# Patient Record
Sex: Female | Born: 1980 | Race: White | Hispanic: No | Marital: Single | Smoking: Current every day smoker
Health system: Southern US, Community
[De-identification: ages and names within clinical notes are randomized; demographics above are authoritative.]

## PROBLEM LIST (undated history)

## (undated) DIAGNOSIS — B1920 Unspecified viral hepatitis C without hepatic coma: Secondary | ICD-10-CM

## (undated) DIAGNOSIS — J45909 Unspecified asthma, uncomplicated: Secondary | ICD-10-CM

---

## 2015-01-09 ENCOUNTER — Emergency Department (HOSPITAL_COMMUNITY)
Admission: EM | Admit: 2015-01-09 | Discharge: 2015-01-09 | Disposition: A | Discharge status: Home / Self Care | Payer: Medicaid - Out of State | Attending: Emergency Medicine | Admitting: Emergency Medicine

## 2015-01-09 ENCOUNTER — Encounter (HOSPITAL_COMMUNITY): Payer: Self-pay

## 2015-01-09 DIAGNOSIS — Z3202 Encounter for pregnancy test, result negative: Secondary | ICD-10-CM | POA: Diagnosis not present

## 2015-01-09 DIAGNOSIS — N939 Abnormal uterine and vaginal bleeding, unspecified: Secondary | ICD-10-CM

## 2015-01-09 DIAGNOSIS — N926 Irregular menstruation, unspecified: Secondary | ICD-10-CM | POA: Insufficient documentation

## 2015-01-09 DIAGNOSIS — Z720 Tobacco use: Secondary | ICD-10-CM | POA: Insufficient documentation

## 2015-01-09 DIAGNOSIS — Z8619 Personal history of other infectious and parasitic diseases: Secondary | ICD-10-CM | POA: Diagnosis not present

## 2015-01-09 HISTORY — DX: Unspecified viral hepatitis C without hepatic coma: B19.20

## 2015-01-09 LAB — URINALYSIS, ROUTINE W REFLEX MICROSCOPIC
Bilirubin Urine: NEGATIVE
GLUCOSE, UA: NEGATIVE mg/dL
Ketones, ur: NEGATIVE mg/dL
NITRITE: NEGATIVE
PH: 5.5 (ref 5.0–8.0)
Protein, ur: NEGATIVE mg/dL
Specific Gravity, Urine: 1.021 (ref 1.005–1.030)
Urobilinogen, UA: 0.2 mg/dL (ref 0.0–1.0)

## 2015-01-09 LAB — WET PREP, GENITAL
Trich, Wet Prep: NONE SEEN
YEAST WET PREP: NONE SEEN

## 2015-01-09 LAB — URINE MICROSCOPIC-ADD ON

## 2015-01-09 LAB — POC URINE PREG, ED: PREG TEST UR: NEGATIVE

## 2015-01-09 NOTE — Discharge Instructions (Signed)
Exam and lab work today were normal. Follow-up with women[facility] for any other issues with menstrual cycle-- ie continued bleeding, discharge, pelvic pain, etc.

## 2015-01-09 NOTE — ED Provider Notes (Signed)
CSN: [id number]     Arrival date & time 01/09/15  2120 History   First MD Initiated Contact with Patient 01/09/15 2211     Chief Complaint  Patient presents with  . Vaginal Bleeding  . Abdominal Cramping     (Consider location/radiation/quality/duration/timing/severity/associated sxs/prior Treatment) Patient is a 34 y.o. female presenting with vaginal bleeding and cramps. The history is provided by the patient and medical records.  Vaginal Bleeding Associated symptoms: abdominal pain (cramping)   Abdominal Cramping Associated symptoms include abdominal pain (cramping).   This is a 34 year old female with past medical history significant for hepatitis C, presenting to the ED for vaginal bleeding and abdominal cramping. Patient states she started her current menstrual cycle on Monday and has continued having bleeding. She states generally her cycles are 3 days in length, but she did not have a menstrual cycle in December or January. States in February she only had light spotting for 3 days. She states she has continued abdominal cramping which is not unusual during her menstrual period.  Denies any other type of abdominal pain.  Denies any fever or chills. No dysuria or vaginal discharge.  Patient is not currently on any form of contraception.  VSS.  Past Medical History  Diagnosis Date  . Hepatitis C    History reviewed. No pertinent past surgical history. No family history on file. History  Substance Use Topics  . Smoking status: Current Every Day Smoker  . Smokeless tobacco: Not on file  . Alcohol Use: Yes     Comment: occasionally    OB History    No data available     Review of Systems  Gastrointestinal: Positive for abdominal pain (cramping).  Genitourinary: Positive for vaginal bleeding.  All other systems reviewed and are negative.     Allergies  Review of patient's allergies indicates no known allergies.  Home Medications   Prior to Admission medications    Medication Sig Start Date End Date Taking? Authorizing Provider  Aspirin-Cinnamedrine-Caffeine (MIDOL MAXIMUM STRENGTH PO) Take 2 tablets by mouth once.   Yes Historical Provider, MD   BP 106/59 mmHg  Pulse 97  Temp(Src) 98.1 F (36.7 C) (Oral)  Resp 16  SpO2 94%  LMP 01/03/2015   Physical Exam  Constitutional: She is oriented to person, place, and time. She appears well-developed and well-nourished. No distress.  HENT:  Head: Normocephalic and atraumatic.  Mouth/Throat: Oropharynx is clear and moist.  Eyes: Conjunctivae and EOM are normal. Pupils are equal, round, and reactive to light.  Neck: Normal range of motion. Neck supple.  Cardiovascular: Normal rate, regular rhythm and normal heart sounds.   Pulmonary/Chest: Effort normal and breath sounds normal. No respiratory distress. She has no wheezes.  Abdominal: Soft. Bowel sounds are normal. There is no tenderness. There is no guarding and no CVA tenderness.  Abdomen soft, non-distended, no focal tenderness or peritoneal signs  Genitourinary: Cervix exhibits no motion tenderness. Right adnexum displays no tenderness. Left adnexum displays no tenderness. There is bleeding in the vagina. No tenderness in the vagina. No foreign body around the vagina. No vaginal discharge found.  Normal female external genitalia without visible lesions; mild amount of blood noted in vaginal vault; cervical os closed, no friability; no adnexal or CMT  Musculoskeletal: Normal range of motion.  Neurological: She is alert and oriented to person, place, and time.  Skin: Skin is warm and dry. She is not diaphoretic.  Psychiatric: She has a normal mood and affect.  Nursing note and  vitals reviewed.   ED Course  Procedures (including critical care time) Labs Review Labs Reviewed  WET PREP, GENITAL - Abnormal; Notable for the following:    Clue Cells Wet Prep HPF POC FEW (*)    WBC, Wet Prep HPF POC FEW (*)    All other components within normal limits   URINALYSIS, ROUTINE W REFLEX MICROSCOPIC - Abnormal; Notable for the following:    APPearance CLOUDY (*)    Hgb urine dipstick LARGE (*)    Leukocytes, UA SMALL (*)    All other components within normal limits  URINE MICROSCOPIC-ADD ON  POC URINE PREG, ED  GC/CHLAMYDIA PROBE AMP ([place])    Imaging Review No results found.   EKG Interpretation None      MDM   Final diagnoses:  Irregular menstrual cycle  Vaginal bleeding   34 year old female with vaginal bleeding. She has had irregular vaginal bleeding for the past 3 months, this month has been somewhat prolonged. She notes associated abdominal cramping which is not unusual for her. Denies dizziness, lightheadedness, or syncope and VS are stable.  On exam, patient is afebrile and nontoxic in appearance. She is in no acute distress and her abdominal exam is benign. Urine pregnancy negative. UA noninfectious. Pelvic exam with mild amount of vaginal bleeding, cervical os closed. There is no adnexal or cervical motion tenderness.  Wet prep with few clue cells, however doubt this indicates infection with BV and patient denies sx of vaginal discharge. Gc/chl pending.  Suspect prolonged bleeding due to her menstrual irregularities over the past 3 months.  Patient encouraged [facility] with [facility] for further evaluation of this.  Discussed plan with patient, he/she acknowledged understanding and agreed with plan of care.  Return precautions given for new or worsening symptoms.  Garlon Hatchet, PA-C 01/10/15 0007  Cathren Laine, MD 01/11/15 [phone]

## 2015-01-09 NOTE — ED Notes (Signed)
Pt presents with c/o heavy vaginal bleeding and abdominal cramping. Pt reports that she started her period on Monday but is still bleeding very heavily and is having lower right abdominal cramping. Pt reports that this much bleeding is abnormal for her.

## 2015-01-10 LAB — GC/CHLAMYDIA PROBE AMP (~~LOC~~) NOT AT ARMC
Chlamydia: NEGATIVE
NEISSERIA GONORRHEA: NEGATIVE

## 2015-06-23 ENCOUNTER — Encounter (HOSPITAL_COMMUNITY): Payer: Self-pay | Admitting: Emergency Medicine

## 2015-06-23 ENCOUNTER — Emergency Department (HOSPITAL_COMMUNITY): Payer: Medicaid - Out of State

## 2015-06-23 ENCOUNTER — Emergency Department (HOSPITAL_COMMUNITY)
Admission: EM | Admit: 2015-06-23 | Discharge: 2015-06-24 | Disposition: A | Discharge status: Home / Self Care | Payer: Medicaid - Out of State | Attending: Emergency Medicine | Admitting: Emergency Medicine

## 2015-06-23 DIAGNOSIS — J069 Acute upper respiratory infection, unspecified: Secondary | ICD-10-CM | POA: Insufficient documentation

## 2015-06-23 DIAGNOSIS — J45901 Unspecified asthma with (acute) exacerbation: Secondary | ICD-10-CM | POA: Insufficient documentation

## 2015-06-23 DIAGNOSIS — Z720 Tobacco use: Secondary | ICD-10-CM | POA: Insufficient documentation

## 2015-06-23 DIAGNOSIS — Z8619 Personal history of other infectious and parasitic diseases: Secondary | ICD-10-CM | POA: Insufficient documentation

## 2015-06-23 HISTORY — DX: Unspecified asthma, uncomplicated: J45.909

## 2015-06-23 LAB — CBC WITH DIFFERENTIAL/PLATELET
BASOS ABS: 0 10*3/uL (ref 0.0–0.1)
Basophils Relative: 1 % (ref 0–1)
Eosinophils Absolute: 0.2 10*3/uL (ref 0.0–0.7)
Eosinophils Relative: 2 % (ref 0–5)
HEMATOCRIT: 38.8 % (ref 36.0–46.0)
Hemoglobin: 13 g/dL (ref 12.0–15.0)
LYMPHS ABS: 3 10*3/uL (ref 0.7–4.0)
LYMPHS PCT: 35 % (ref 12–46)
MCH: 31.2 pg (ref 26.0–34.0)
MCHC: 33.5 g/dL (ref 30.0–36.0)
MCV: 93 fL (ref 78.0–100.0)
MONO ABS: 0.9 10*3/uL (ref 0.1–1.0)
Monocytes Relative: 10 % (ref 3–12)
NEUTROS ABS: 4.4 10*3/uL (ref 1.7–7.7)
Neutrophils Relative %: 52 % (ref 43–77)
Platelets: 211 10*3/uL (ref 150–400)
RBC: 4.17 MIL/uL (ref 3.87–5.11)
RDW: 13.7 % (ref 11.5–15.5)
WBC: 8.5 10*3/uL (ref 4.0–10.5)

## 2015-06-23 LAB — I-STAT CHEM 8, ED
BUN: 20 mg/dL (ref 6–20)
CALCIUM ION: 1.15 mmol/L (ref 1.12–1.23)
CHLORIDE: 101 mmol/L (ref 101–111)
CREATININE: 0.7 mg/dL (ref 0.44–1.00)
GLUCOSE: 98 mg/dL (ref 65–99)
HCT: 40 % (ref 36.0–46.0)
Hemoglobin: 13.6 g/dL (ref 12.0–15.0)
Potassium: 3.9 mmol/L (ref 3.5–5.1)
Sodium: 140 mmol/L (ref 135–145)
TCO2: 27 mmol/L (ref 0–100)

## 2015-06-23 LAB — I-STAT TROPONIN, ED: Troponin i, poc: 0 ng/mL (ref 0.00–0.08)

## 2015-06-23 MED ORDER — PREDNISONE 20 MG PO TABS
60.0000 mg | ORAL_TABLET | Freq: Once | ORAL | Status: AC
  Administered 2015-06-23: 60 mg via ORAL
  Filled 2015-06-23: qty 3

## 2015-06-23 MED ORDER — IPRATROPIUM BROMIDE 0.02 % IN SOLN
0.5000 mg | Freq: Once | RESPIRATORY_TRACT | Status: AC
  Administered 2015-06-23: 0.5 mg via RESPIRATORY_TRACT
  Filled 2015-06-23: qty 2.5

## 2015-06-23 MED ORDER — ALBUTEROL SULFATE (2.5 MG/3ML) 0.083% IN NEBU
5.0000 mg | INHALATION_SOLUTION | Freq: Once | RESPIRATORY_TRACT | Status: AC
  Administered 2015-06-23: 5 mg via RESPIRATORY_TRACT
  Filled 2015-06-23: qty 6

## 2015-06-23 NOTE — ED Notes (Signed)
Pt states she has been congested and had a cough for the past couple of days   Pt states her cough is productive with clear to cloudy to yellow in color  Pt states she feels short of breath that is worse on exertion  Pt has hx of asthma and is wheezing in triage

## 2015-06-23 NOTE — ED Provider Notes (Signed)
CSN: [id number]     Arrival date & time 06/23/15  2151 History   First MD Initiated Contact with Patient 06/23/15 2215     Chief Complaint  Patient presents with  . Cough     (Consider location/radiation/quality/duration/timing/severity/associated sxs/prior Treatment) HPI Comments: Patient with a history of Asthma presents today with complaints of SOB, productive cough, and wheezing.  She reports onset of symptoms two days ago.  She states that symptoms are gradually worsening.  She reports associated nasal congestion and states that she has had chest pain after coughing.  She denies chest pain at this time.  She denies fever, chills, nausea, vomiting, sinus pain, or hemoptysis.  She states that she has been taking Nyquil, Mucinex, and using her Albuterol inhaler without relief.  She currently smokes 1 ppd.  The history is provided by the patient.    Past Medical History  Diagnosis Date  . Hepatitis C   . Asthma    History reviewed. No pertinent past surgical history. Family History  Problem Relation Age of Onset  . Thyroid disease Mother   . Cancer Other   . Diabetes Other    Social History  Substance Use Topics  . Smoking status: Current Every Day Smoker -- 1.00 packs/day    Types: Cigarettes  . Smokeless tobacco: None  . Alcohol Use: No   OB History    No data available     Review of Systems  All other systems reviewed and are negative.     Allergies  Review of patient's allergies indicates no known allergies.  Home Medications   Prior to Admission medications   Medication Sig Start Date End Date Taking? Authorizing Provider  dextromethorphan-guaiFENesin (MUCINEX DM) 30-600 MG per 12 hr tablet Take 1 tablet by mouth 2 (two) times daily as needed for cough.   Yes Historical Provider, MD  Pseudoeph-Doxylamine-DM-APAP (NYQUIL PO) Take 30 mLs by mouth daily as needed (cold symptoms).   Yes Historical Provider, MD   BP 125/76 mmHg  Pulse 88  Temp(Src) 98.2 F  (36.8 C) (Oral)  Resp 24  SpO2 94%  LMP 06/14/2015 (Exact Date) Physical Exam  Constitutional: She appears well-developed and well-nourished.  HENT:  Head: Normocephalic and atraumatic.  Mouth/Throat: Oropharynx is clear and moist.  Neck: Normal range of motion. Neck supple.  Cardiovascular: Normal rate, regular rhythm and normal heart sounds.   Pulmonary/Chest: Effort normal. No respiratory distress. She has wheezes. She has no rales. She exhibits no tenderness.  Diffuse inspiratory and expiratory wheezing on exam  Musculoskeletal: Normal range of motion.  Neurological: She is alert.  Skin: Skin is warm and dry.  Psychiatric: She has a normal mood and affect.    ED Course  Procedures (including critical care time) Labs Review Labs Reviewed  CBC WITH DIFFERENTIAL/PLATELET  I-STAT CHEM 8, ED  I-STAT TROPOININ, ED    Imaging Review No results found. I have personally reviewed and evaluated these images and lab results as part of my medical decision-making.   EKG Interpretation   Date/Time:  Thursday June 23 2015 22:35:06 EDT Ventricular Rate:  83 PR Interval:  134 QRS Duration: 99 QT Interval:  369 QTC Calculation: 434 R Axis:   76 Text Interpretation:  Sinus rhythm ED PHYSICIAN INTERPRETATION AVAILABLE  IN [facility] Confirmed by TEST, Record ([id number]) on 06/24/2015 7:18:55  AM     2:30 AM Ambulated patient.  Pulse ox 92-93 on RA with brisk ambulation.  No SOB or CP with ambulation.  MDM   Final diagnoses:  None   Patient ambulated in ED with O2 saturations maintained >92, no current signs of respiratory distress. Lung exam improved after nebulizer treatment. Prednisone given in the ED and pt will bd dc with 5 day burst. Pt states they are breathing at baseline. Pt has been instructed to continue using prescribed medications and to speak with PCP about today's exacerbation.  Patient stable for discharge.  Return precautions given.     Santiago Glad,  PA-C 06/25/15 Ernestina Columbia  Bethann Berkshire, MD 06/25/15 2330

## 2015-06-24 MED ORDER — IPRATROPIUM BROMIDE 0.02 % IN SOLN
0.5000 mg | Freq: Once | RESPIRATORY_TRACT | Status: AC
  Administered 2015-06-24: 0.5 mg via RESPIRATORY_TRACT
  Filled 2015-06-24: qty 2.5

## 2015-06-24 MED ORDER — ALBUTEROL SULFATE (2.5 MG/3ML) 0.083% IN NEBU
5.0000 mg | INHALATION_SOLUTION | Freq: Once | RESPIRATORY_TRACT | Status: AC
  Administered 2015-06-24: 5 mg via RESPIRATORY_TRACT
  Filled 2015-06-24: qty 6

## 2015-06-24 MED ORDER — PREDNISONE 20 MG PO TABS: 60.0000 mg | ORAL_TABLET | Freq: Every day | ORAL | Status: AC

## 2015-06-24 MED ORDER — ALBUTEROL SULFATE HFA 108 (90 BASE) MCG/ACT IN AERS: 1.0000 | INHALATION_SPRAY | Freq: Four times a day (QID) | RESPIRATORY_TRACT | Status: AC | PRN

## 2016-07-18 ENCOUNTER — Emergency Department (HOSPITAL_COMMUNITY)
Admission: EM | Admit: 2016-07-18 | Discharge: 2016-07-18 | Disposition: A | Discharge status: Home / Self Care | Payer: Medicaid - Out of State | Attending: Emergency Medicine | Admitting: Emergency Medicine

## 2016-07-18 ENCOUNTER — Emergency Department (HOSPITAL_COMMUNITY): Payer: Medicaid - Out of State

## 2016-07-18 ENCOUNTER — Encounter (HOSPITAL_COMMUNITY): Payer: Self-pay

## 2016-07-18 DIAGNOSIS — S01551A Open bite of lip, initial encounter: Secondary | ICD-10-CM | POA: Insufficient documentation

## 2016-07-18 DIAGNOSIS — S61051A Open bite of right thumb without damage to nail, initial encounter: Secondary | ICD-10-CM | POA: Insufficient documentation

## 2016-07-18 DIAGNOSIS — S51052A Open bite, left elbow, initial encounter: Secondary | ICD-10-CM | POA: Insufficient documentation

## 2016-07-18 DIAGNOSIS — S91352A Open bite, left foot, initial encounter: Secondary | ICD-10-CM | POA: Insufficient documentation

## 2016-07-18 DIAGNOSIS — S51851A Open bite of right forearm, initial encounter: Secondary | ICD-10-CM | POA: Insufficient documentation

## 2016-07-18 DIAGNOSIS — Y929 Unspecified place or not applicable: Secondary | ICD-10-CM | POA: Insufficient documentation

## 2016-07-18 DIAGNOSIS — Z23 Encounter for immunization: Secondary | ICD-10-CM | POA: Insufficient documentation

## 2016-07-18 DIAGNOSIS — J45909 Unspecified asthma, uncomplicated: Secondary | ICD-10-CM | POA: Insufficient documentation

## 2016-07-18 DIAGNOSIS — Y999 Unspecified external cause status: Secondary | ICD-10-CM | POA: Insufficient documentation

## 2016-07-18 DIAGNOSIS — W540XXA Bitten by dog, initial encounter: Secondary | ICD-10-CM | POA: Insufficient documentation

## 2016-07-18 DIAGNOSIS — Y9389 Activity, other specified: Secondary | ICD-10-CM | POA: Insufficient documentation

## 2016-07-18 DIAGNOSIS — S61451A Open bite of right hand, initial encounter: Secondary | ICD-10-CM | POA: Insufficient documentation

## 2016-07-18 DIAGNOSIS — F17210 Nicotine dependence, cigarettes, uncomplicated: Secondary | ICD-10-CM | POA: Insufficient documentation

## 2016-07-18 MED ORDER — CEPHALEXIN 500 MG PO CAPS: 500.0000 mg | ORAL_CAPSULE | Freq: Four times a day (QID) | ORAL | 0 refills | Status: AC

## 2016-07-18 MED ORDER — TETANUS-DIPHTH-ACELL PERTUSSIS 5-2.5-18.5 LF-MCG/0.5 IM SUSP
0.5000 mL | Freq: Once | INTRAMUSCULAR | Status: AC
  Administered 2016-07-18: 0.5 mL via INTRAMUSCULAR
  Filled 2016-07-18: qty 0.5

## 2016-07-18 MED ORDER — ONDANSETRON HCL 4 MG/2ML IJ SOLN
4.0000 mg | Freq: Once | INTRAMUSCULAR | Status: AC
  Administered 2016-07-18: 4 mg via INTRAVENOUS
  Filled 2016-07-18: qty 2

## 2016-07-18 MED ORDER — SODIUM CHLORIDE 0.9 % IV BOLUS (SEPSIS): 1000.0000 mL | Freq: Once | INTRAVENOUS | Status: DC

## 2016-07-18 MED ORDER — LIDOCAINE HCL (PF) 2 % IJ SOLN
10.0000 mL | Freq: Once | INTRAMUSCULAR | Status: AC
  Administered 2016-07-18: 10 mL
  Filled 2016-07-18: qty 10

## 2016-07-18 MED ORDER — LIDOCAINE-EPINEPHRINE (PF) 2 %-1:200000 IJ SOLN
20.0000 mL | Freq: Once | INTRAMUSCULAR | Status: AC
  Administered 2016-07-18: 20 mL
  Filled 2016-07-18: qty 20

## 2016-07-18 MED ORDER — HYDROCODONE-ACETAMINOPHEN 5-325 MG PO TABS: 1.0000 | ORAL_TABLET | ORAL | 0 refills | Status: AC | PRN

## 2016-07-18 MED ORDER — SODIUM CHLORIDE 0.9 % IV SOLN
3.0000 g | Freq: Once | INTRAVENOUS | Status: AC
  Administered 2016-07-18: 3 g via INTRAVENOUS
  Filled 2016-07-18: qty 3

## 2016-07-18 MED ORDER — MORPHINE SULFATE (PF) 4 MG/ML IV SOLN
4.0000 mg | Freq: Once | INTRAVENOUS | Status: AC
Start: 2016-07-18 — End: 2016-07-18
  Administered 2016-07-18: 4 mg via INTRAVENOUS
  Filled 2016-07-18: qty 1

## 2016-07-18 MED ORDER — BACITRACIN ZINC 500 UNIT/GM EX OINT
TOPICAL_OINTMENT | CUTANEOUS | Status: AC
  Filled 2016-07-18: qty 2.7

## 2016-07-18 NOTE — ED Provider Notes (Signed)
[facility]-EMERGENCY DEPT Provider Note   CSN: [id number] Arrival date & time: 07/18/16  1144  By signing my name below, I, Placido Sou, attest that this documentation has been prepared under the direction and in the presence of Jacalyn Lefevre, MD. Electronically Signed: Placido Sou, ED Scribe. 07/18/16. 12:17 PM.   History   Chief Complaint Chief Complaint  Patient presents with  . Animal Bite    HPI HPI Comments: Megan Calhoun is a 35 y.o. female who presents to the Emergency Department by ambulance complaining of multiple dog bites. Pt states that she has two pit bulls which were fighting and attempted to break them up resulting in multiple wounds. Pt reports bite marks and scratches to her right forearm, right hand, left arm, right thigh, left foot and right lower lip. Pt also has a moderate laceration with mild bleeding to her right forearm. Pt denies her tetanus is UTD. Pt is unsure if the dog's are UTD on their vaccinations. Animal control has been notified regarding the incident. No other associated symptoms at this time.   Pt said the dogs are normally good friends, but 1 of them had puppies, and pt thinks the other one was jealous.  Otherwise both dogs have been acting normally.  The history is provided by the patient. No language interpreter was used.    Past Medical History:  Diagnosis Date  . Asthma   . Hepatitis C     There are no active problems to display for this patient.   History reviewed. No pertinent surgical history.  OB History    No data available       Home Medications    Prior to Admission medications   Medication Sig Start Date End Date Taking? Authorizing Provider  albuterol (PROVENTIL HFA;VENTOLIN HFA) 108 (90 BASE) MCG/ACT inhaler Inhale 1-2 puffs into the lungs every 6 (six) hours as needed for wheezing or shortness of breath. 06/24/15   Heather Laisure, PA-C  cephALEXin (KEFLEX) 500 MG capsule Take 1 capsule (500 mg total) by mouth 4  (four) times daily. 07/18/16   Jacalyn Lefevre, MD  dextromethorphan-guaiFENesin [facility] DM) 30-600 MG per 12 hr tablet Take 1 tablet by mouth 2 (two) times daily as needed for cough.    Historical Provider, MD  HYDROcodone-acetaminophen (NORCO/VICODIN) 5-325 MG tablet Take 1 tablet by mouth every 4 (four) hours as needed. 07/18/16   Jacalyn Lefevre, MD  predniSONE (DELTASONE) 20 MG tablet Take 3 tablets (60 mg total) by mouth daily. 06/24/15   Heather Laisure, PA-C  Pseudoeph-Doxylamine-DM-APAP (NYQUIL PO) Take 30 mLs by mouth daily as needed (cold symptoms).    Historical Provider, MD    Family History Family History  Problem Relation Age of Onset  . Thyroid disease Mother   . Cancer Other   . Diabetes Other     Social History Social History  Substance Use Topics  . Smoking status: Current Every Day Smoker    Packs/day: 1.00    Types: Cigarettes  . Smokeless tobacco: Never Used  . Alcohol use No     Allergies   Review of patient's allergies indicates no known allergies.   Review of Systems Review of Systems  Musculoskeletal: Positive for myalgias.  Skin: Positive for wound.  Neurological: Negative for syncope and numbness.  All other systems reviewed and are negative.  Physical Exam Updated Vital Signs BP 122/77 (BP Location: Left Arm)   Pulse 75   Temp 97.6 F (36.4 C) (Oral)   Resp 16  Ht 5\' 4"  (1.626 m)   Wt 155 lb (70.3 kg)   LMP 07/04/2016   SpO2 100%   BMI 26.61 kg/m   Physical Exam  Constitutional: She is oriented to person, place, and time. She appears well-developed and well-nourished.  HENT:  Head: Normocephalic and atraumatic.  Eyes: EOM are normal.  Neck: Normal range of motion.  Cardiovascular: Normal rate.   Pulmonary/Chest: Effort normal. No respiratory distress.  Abdominal: Soft.  Musculoskeletal: Normal range of motion.  Neurological: She is alert and oriented to person, place, and time.  Skin: Skin is warm and dry. Abrasion and laceration  noted.  Laceration and abrasions noted to the right forearm. Multiple bite marks to the right hand and thumb. Bite marks noted to the left arm. Scratches to the right thigh. Bite marks to the left foot. Bite mark to the right lower lip.    Psychiatric: She has a normal mood and affect.  Nursing note and vitals reviewed.  ED Treatments / Results  Labs (all labs ordered are listed, but only abnormal results are displayed) Labs Reviewed  PREGNANCY, URINE    EKG  EKG Interpretation None       Radiology Dg Hand Complete Right  Result Date: 07/18/2016 CLINICAL DATA:  Multiple dog bites to the right hand today. EXAM: RIGHT HAND - COMPLETE 3+ VIEW COMPARISON:  None. FINDINGS: There is no evidence of fracture or dislocation. There is no evidence of arthropathy or other focal bone abnormality. No radiodense foreign bodies. Soft tissue swelling with some air in the soft tissues. IMPRESSION: Soft tissue swelling with air in the soft tissues. Electronically Signed   By: Francene BoyersJames  Maxwell M.D.   On: 07/18/2016 12:54    Procedures .Marland Kitchen.Laceration Repair Date/Time: 07/18/2016 1:34 PM Performed by: Jacalyn LefevreHAVILAND, Barnett Elzey Authorized by: Jacalyn LefevreHAVILAND, Peterson Mathey   Consent:    Consent obtained:  Verbal   Consent given by:  Patient   Risks discussed:  Infection, pain, need for additional repair and poor wound healing   Alternatives discussed:  No treatment Anesthesia (see MAR for exact dosages):    Anesthesia method:  Local infiltration   Local anesthetic:  Lidocaine 2% WITH epi Laceration details:    Location:  Shoulder/arm   Shoulder/arm location:  R lower arm Repair type:    Repair type:  Simple Pre-procedure details:    Preparation:  Patient was prepped and draped in usual sterile fashion Exploration:    Hemostasis achieved with:  Epinephrine   Wound exploration: wound explored through full range of motion     Contaminated: no   Treatment:    Area cleansed with:  Saline   Amount of cleaning:  Standard    Irrigation solution:  Sterile saline   Visualized foreign bodies/material removed: no   Skin repair:    Repair method:  Sutures   Suture size:  4-0   Suture material:  Nylon   Suture technique:  Simple interrupted   Number of sutures:  11 Approximation:    Approximation:  Close Post-procedure details:    Dressing:  Antibiotic ointment, non-adherent dressing and sterile dressing   Patient tolerance of procedure:  Tolerated well, no immediate complications Comments:     Pt had 4 bite wounds to right forearm.  Each was approximately 1 cm.  Total of 11 stitches in forearm. Marland Kitchen..Laceration Repair Date/Time: 07/18/2016 1:36 PM Performed by: Jacalyn LefevreHAVILAND, Eamon Tantillo Authorized by: Jacalyn LefevreHAVILAND, Deyanna Mctier   Consent:    Consent obtained:  Verbal   Consent given by:  Patient  Risks discussed:  Infection, pain and need for additional repair   Alternatives discussed:  No treatment Anesthesia (see MAR for exact dosages):    Anesthesia method:  Local infiltration   Local anesthetic:  Lidocaine 2% w/o epi Laceration details:    Location:  Finger   Finger location:  R thumb Repair type:    Repair type:  Simple Pre-procedure details:    Preparation:  Patient was prepped and draped in usual sterile fashion Treatment:    Area cleansed with:  Saline   Amount of cleaning:  Standard   Irrigation solution:  Sterile saline Skin repair:    Repair method:  Sutures   Suture size:  4-0   Suture material:  Nylon   Number of sutures:  2 Approximation:    Approximation:  Close Post-procedure details:    Dressing:  Antibiotic ointment, non-adherent dressing and sterile dressing   Patient tolerance of procedure:  Tolerated well, no immediate complications Comments:     Pt had 2 small bite marks to base of right thumb.  1 suture was placed in each.    DIAGNOSTIC STUDIES: Oxygen Saturation is 100% on RA, normal by my interpretation.    COORDINATION OF CARE: 12:16 PM Discussed next steps with pt. Pt verbalized  understanding and is agreeable with the plan.    Medications Ordered in ED Medications  sodium chloride 0.9 % bolus 1,000 mL (not administered)  Ampicillin-Sulbactam (UNASYN) 3 g in sodium chloride 0.9 % 100 mL IVPB (3 g Intravenous New Bag/Given 07/18/16 1308)  bacitracin 500 UNIT/GM ointment (not administered)  Tdap (BOOSTRIX) injection 0.5 mL (0.5 mLs Intramuscular Given 07/18/16 1307)  morphine 4 MG/ML injection 4 mg (4 mg Intravenous Given 07/18/16 1307)  ondansetron (ZOFRAN) injection 4 mg (4 mg Intravenous Given 07/18/16 1307)  lidocaine-EPINEPHrine (XYLOCAINE W/EPI) 2 %-1:200000 (PF) injection 20 mL (20 mLs Infiltration Given 07/18/16 1307)  lidocaine (XYLOCAINE) 2 % injection 10 mL (10 mLs Infiltration Given 07/18/16 1308)     Initial Impression / Assessment and Plan / ED Course  I have reviewed the triage vital signs and the nursing notes.  Pertinent labs & imaging results that were available during my care of the patient were reviewed by me and considered in my medical decision making (see chart for details).  Clinical Course    I personally performed the services described in this documentation, which was scribed in my presence. The recorded information has been reviewed and is accurate.   Pt given tetanus, IV unasyn here to prevent infection.  She will be d/c'd home on keflex.  She knows that animal bites have a high risk of infection and she knows to return if she notices any redness or swelling.    The pt said that 1 of her dogs did not have her rabies vaccine.  She opts not to get rabies shots now as the dogs have been acting normally until this episode.   Final Clinical Impressions(s) / ED Diagnoses   Final diagnoses:  Dog bite of right hand, initial encounter  Dog bite of right forearm, initial encounter  Dog bite of left elbow, initial encounter    New Prescriptions New Prescriptions   CEPHALEXIN (KEFLEX) 500 MG CAPSULE    Take 1 capsule (500 mg total) by mouth 4  (four) times daily.   HYDROCODONE-ACETAMINOPHEN (NORCO/VICODIN) 5-325 MG TABLET    Take 1 tablet by mouth every 4 (four) hours as needed.     Jacalyn Lefevre, MD 07/18/16 1339

## 2016-07-18 NOTE — ED Triage Notes (Signed)
Per ems, animal control was notified.

## 2016-07-18 NOTE — ED Triage Notes (Signed)
Brought in by [facility]. Patient got between 2 Pit Bull's while fighting. Dog bites to right forearm, right upper thigh and lip. Pressure dressing applied. Unknown if dogs are UTD on shots. Animal control on scene per EMS.

## 2016-07-18 NOTE — ED Triage Notes (Signed)
Pt reports her 2 pit bulls were fighting and she got between them to break them up and she was bit on r arm, left arm, left foot, and mulitple scratches all over.  Dried blood noted to lip, pt says was bit on bottom lip.  Pt unsure of status of rabies vaccine on the dogs.  Pt also unsure of her last tetanus shot.

## 2016-11-14 IMAGING — CR DG CHEST 2V
2 series · 2 of 2 positions shown · non-contrast
Comparison: None.

CLINICAL DATA: Cough and congestion, history of asthma

EXAM:
CHEST  2 VIEW

[w chest pa]
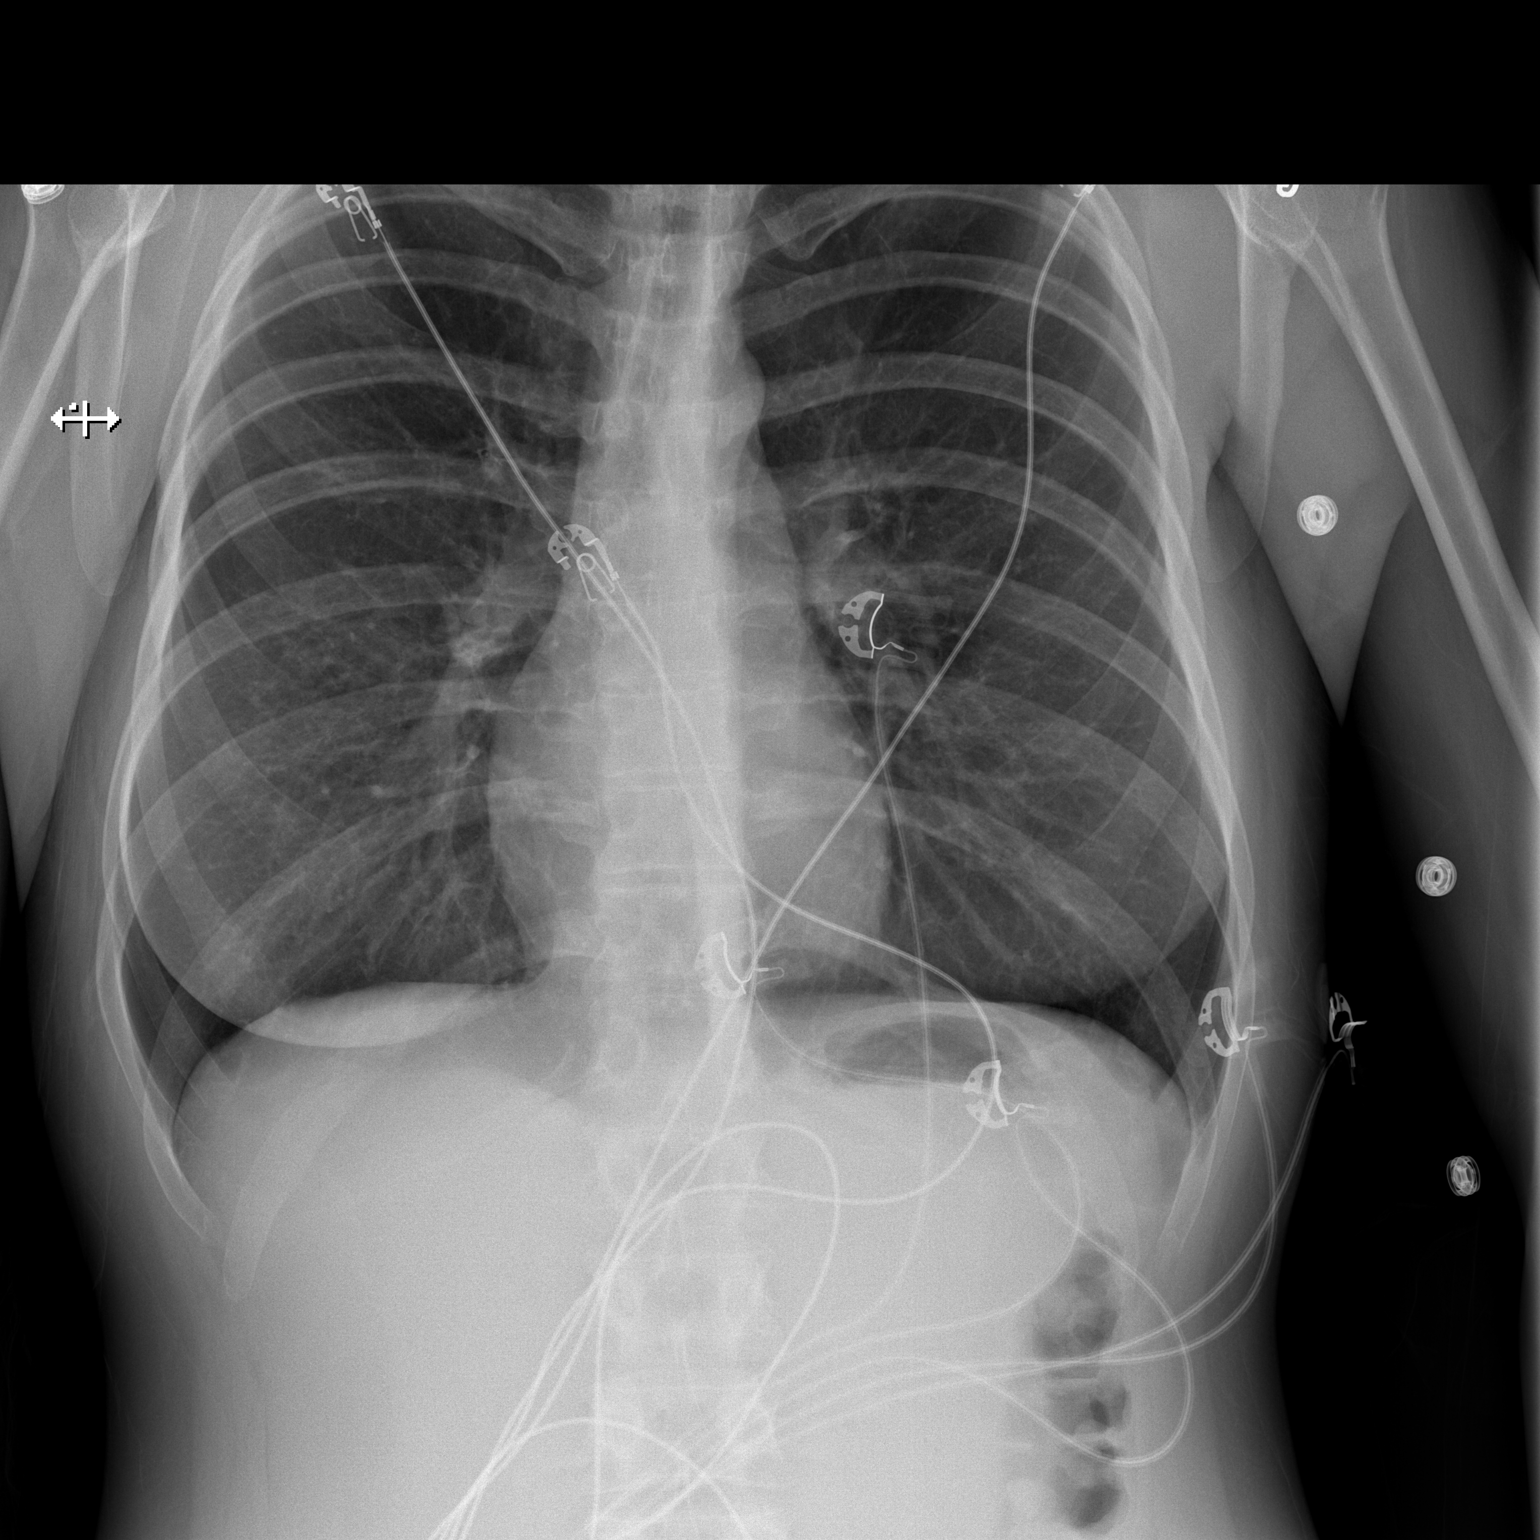

[w chest lat]
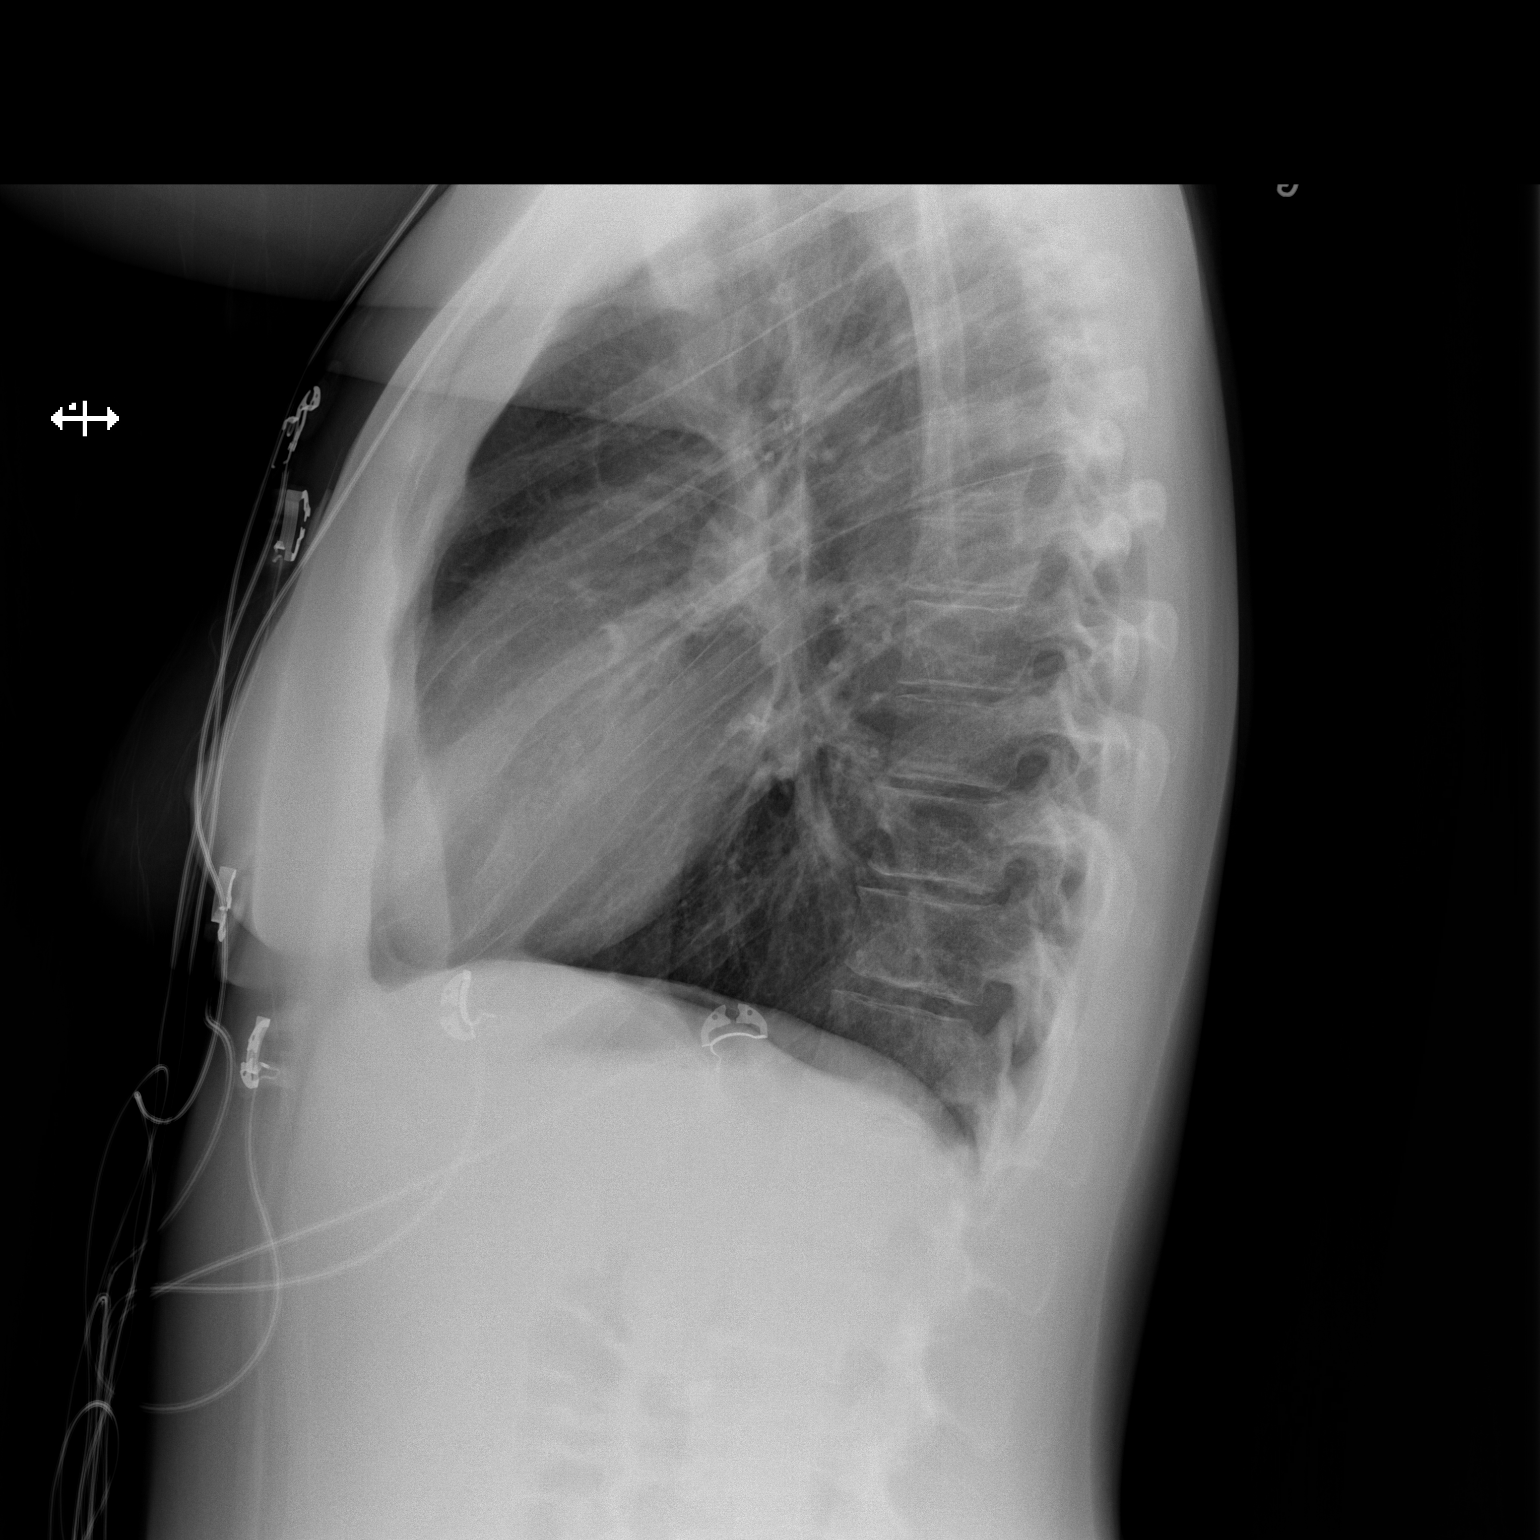

[2 of 2 positions shown; findings below may reference images not displayed]

FINDINGS: The heart size and mediastinal contours are within normal limits.
Both lungs are clear. The visualized skeletal structures are
unremarkable.
IMPRESSION: No active cardiopulmonary disease.

## 2017-12-10 IMAGING — CR DG HAND COMPLETE 3+V*R*
3 series · 3 of 3 positions shown · non-contrast
Comparison: None.

CLINICAL DATA: Multiple dog bites to the right hand today.

EXAM:
RIGHT HAND - COMPLETE 3+ VIEW

[pa]
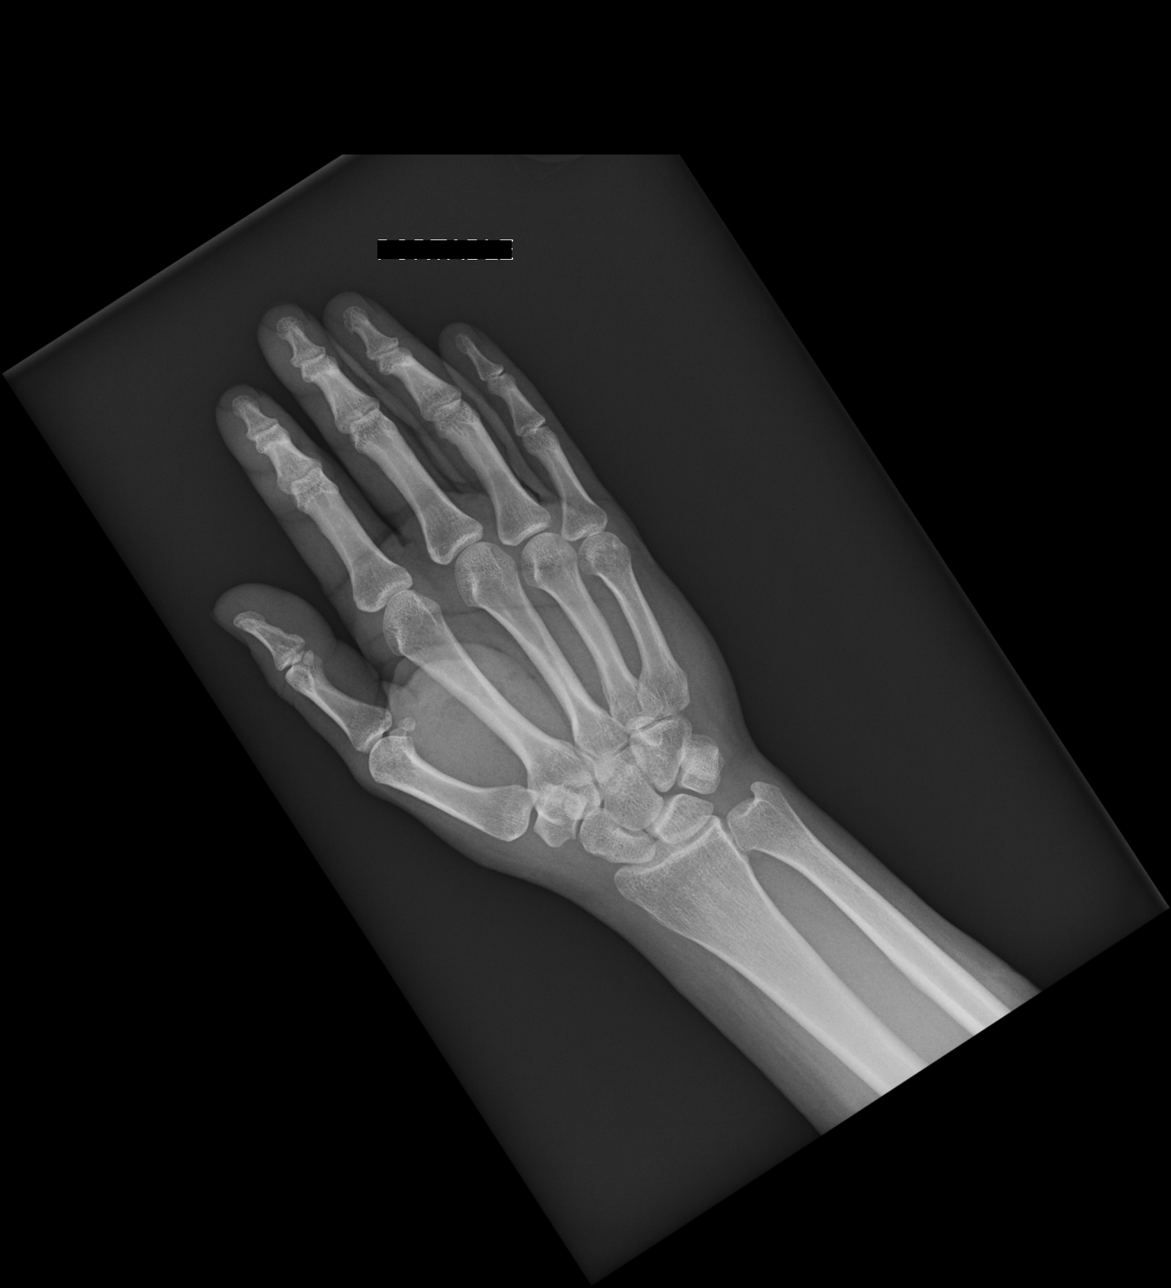

[oblique]
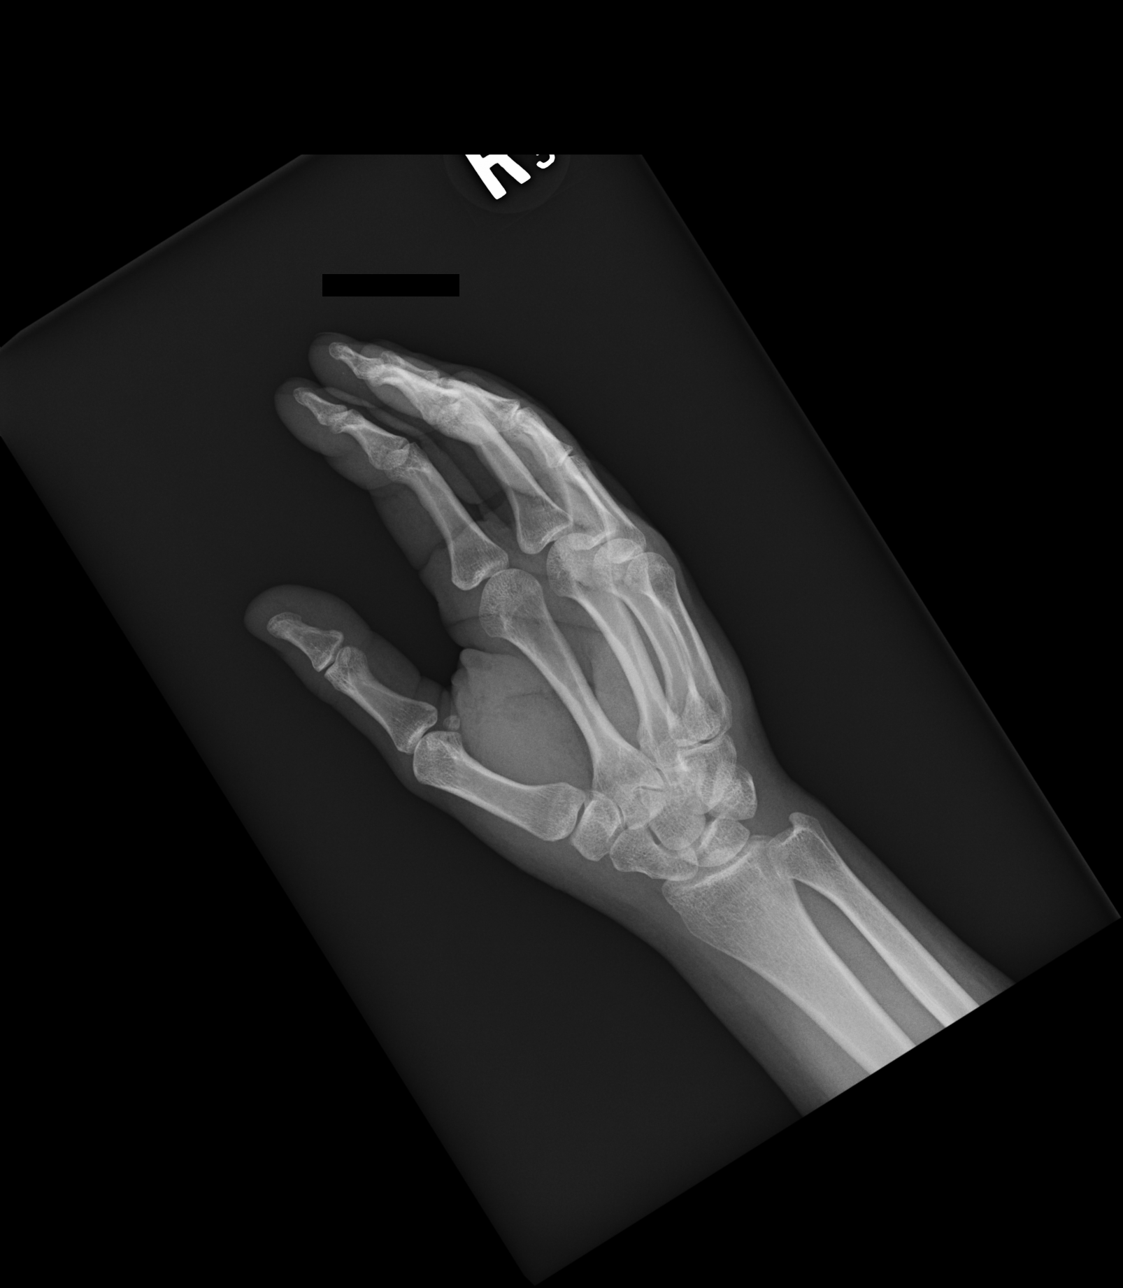

[lat]
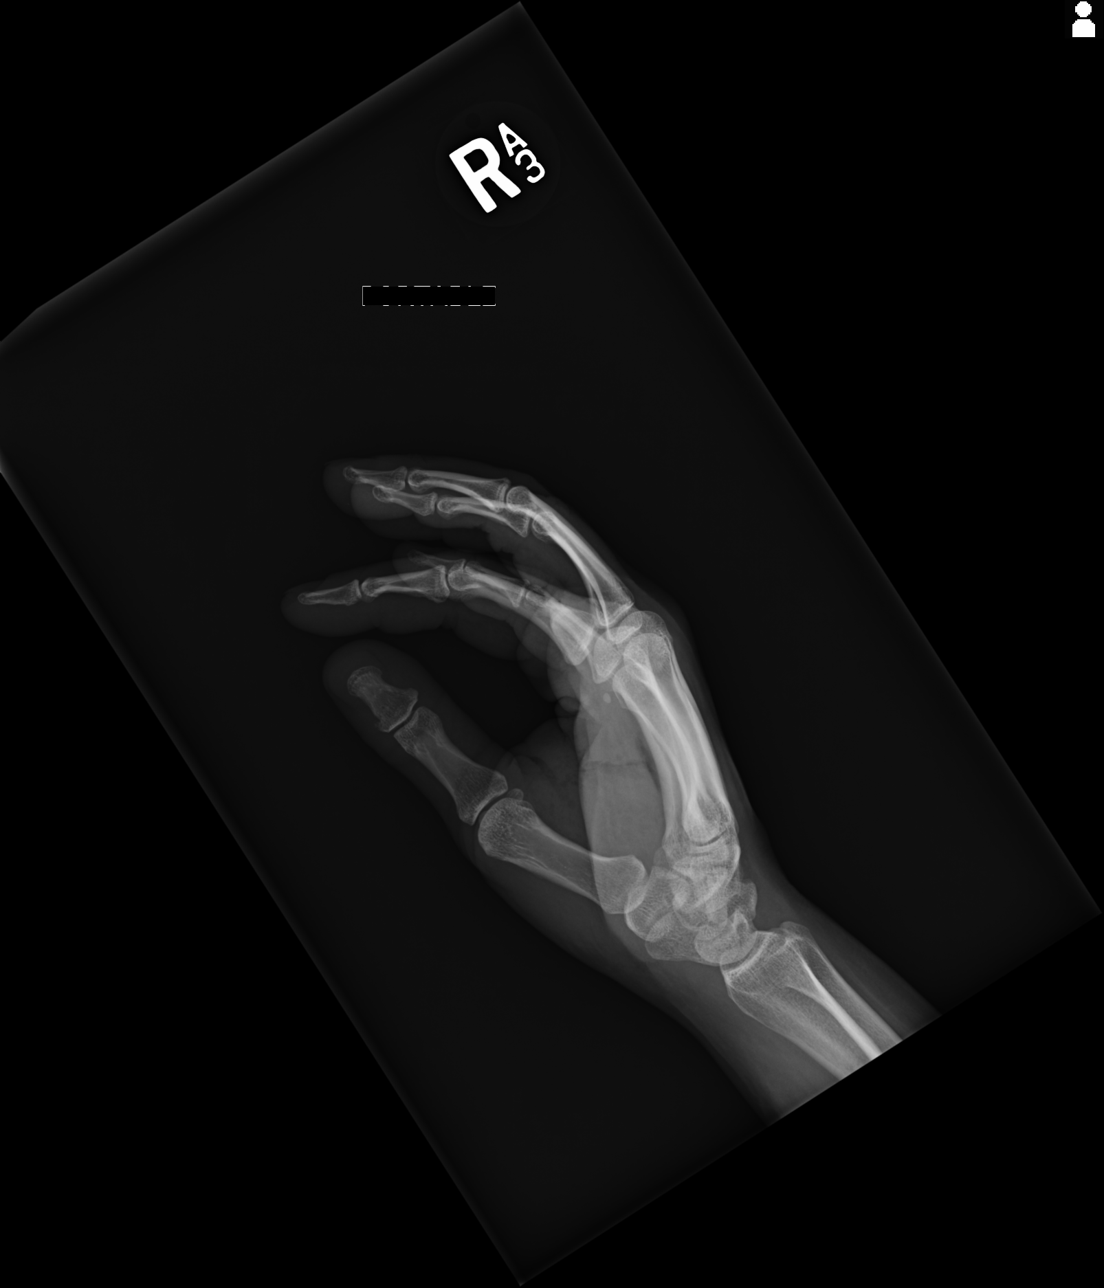

[3 of 3 positions shown; findings below may reference images not displayed]

FINDINGS: There is no evidence of fracture or dislocation. There is no
evidence of arthropathy or other focal bone abnormality. No
radiodense foreign bodies. Soft tissue swelling with some air in the
soft tissues.
IMPRESSION: Soft tissue swelling with air in the soft tissues.
